# Patient Record
Sex: Female | Born: 1990 | Race: Black or African American | Hispanic: No | Marital: Single | Smoking: Never smoker
Health system: Southern US, Community
[De-identification: ages and names within clinical notes are randomized; demographics above are authoritative.]

## PROBLEM LIST (undated history)

## (undated) DIAGNOSIS — I319 Disease of pericardium, unspecified: Secondary | ICD-10-CM

## (undated) DIAGNOSIS — A64 Unspecified sexually transmitted disease: Secondary | ICD-10-CM

## (undated) HISTORY — DX: Disease of pericardium, unspecified: I31.9

## (undated) HISTORY — DX: Unspecified sexually transmitted disease: A64

## (undated) HISTORY — PX: OTHER SURGICAL HISTORY: SHX169

---
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
## 2011-03-24 ENCOUNTER — Emergency Department (HOSPITAL_COMMUNITY)
Admission: EM | Admit: 2011-03-24 | Discharge: 2011-03-24 | Disposition: A | Discharge status: Home / Self Care | Payer: Medicaid Other | Attending: Emergency Medicine | Admitting: Emergency Medicine

## 2011-03-24 DIAGNOSIS — R0789 Other chest pain: Secondary | ICD-10-CM | POA: Insufficient documentation

## 2011-03-24 DIAGNOSIS — I319 Disease of pericardium, unspecified: Secondary | ICD-10-CM | POA: Insufficient documentation

## 2011-03-24 LAB — POCT I-STAT TROPONIN I: Troponin i, poc: 0 ng/mL (ref 0.00–0.08)

## 2011-03-28 ENCOUNTER — Emergency Department (HOSPITAL_COMMUNITY)
Admission: EM | Admit: 2011-03-28 | Discharge: 2011-03-28 | Disposition: A | Discharge status: Home / Self Care | Payer: Medicaid Other | Attending: Emergency Medicine | Admitting: Emergency Medicine

## 2011-03-28 ENCOUNTER — Emergency Department (HOSPITAL_COMMUNITY)
Admission: EM | Admit: 2011-03-28 | Discharge: 2011-03-28 | Discharge status: Against Medical Advice | Payer: Medicaid Other | Source: Home / Self Care | Attending: Emergency Medicine | Admitting: Emergency Medicine

## 2011-03-28 DIAGNOSIS — N898 Other specified noninflammatory disorders of vagina: Secondary | ICD-10-CM | POA: Insufficient documentation

## 2011-03-28 DIAGNOSIS — R1032 Left lower quadrant pain: Secondary | ICD-10-CM | POA: Insufficient documentation

## 2011-03-28 DIAGNOSIS — R109 Unspecified abdominal pain: Secondary | ICD-10-CM | POA: Insufficient documentation

## 2011-03-28 LAB — URINALYSIS, ROUTINE W REFLEX MICROSCOPIC
Nitrite: NEGATIVE
Protein, ur: 30 mg/dL — AB
Specific Gravity, Urine: 1.031 — ABNORMAL HIGH (ref 1.005–1.030)
Urobilinogen, UA: 0.2 mg/dL (ref 0.0–1.0)

## 2011-03-28 LAB — URINE MICROSCOPIC-ADD ON

## 2011-04-16 ENCOUNTER — Encounter: Payer: Self-pay | Admitting: Internal Medicine

## 2011-04-17 ENCOUNTER — Encounter: Payer: Self-pay | Admitting: Internal Medicine

## 2011-04-17 ENCOUNTER — Ambulatory Visit (INDEPENDENT_AMBULATORY_CARE_PROVIDER_SITE_OTHER): Payer: Medicaid Other | Admitting: Internal Medicine

## 2011-04-17 DIAGNOSIS — R079 Chest pain, unspecified: Secondary | ICD-10-CM

## 2011-04-17 LAB — SEDIMENTATION RATE: Sed Rate: 8 mm/hr (ref 0–22)

## 2011-04-17 NOTE — Progress Notes (Addendum)
HPI Patient notes a burning/stinging sensation in chest   L to R side of chests  Last about 5 minutes.  Minimally pleuritic. Not positional.  Prilosec  No change in symptoms.  Colchicine felt bettre but then it would wear off.  End of 14 days continued. No fevers or chills.   No illness in June.  First spell occurred while at home with mom watching TV.   Comes and goes throughout day.  Shona Needles was first day with no pain.    Today just started.  Having symptoms. Very mild dizziness  No syncope. Dances frequently.  DOes  Not make pain worse. Very SOB on 8/20  Breathing OK now.  No recnet ltravel   Current Outpatient Prescriptions  Medication Sig Dispense Refill  . NON FORMULARY BIRTH CONTROL       . omeprazole (PRILOSEC OTC) 20 MG tablet Take 20 mg by mouth daily.          Past Medical History  Diagnosis Date  . Chest pain   . Pericarditis      No family history on file.  History   Social History  . Marital Status: Single    Spouse Name: N/A    Number of Children: N/A  . Years of Education: N/A   Occupational History  . Not on file.   Social History Main Topics  . Smoking status: Never Smoker   . Smokeless tobacco: Not on file  . Alcohol Use: No  . Drug Use: Yes  . Sexually Active: Not on file   Other Topics Concern  . Not on file   Social History Narrative  . No narrative on file    Review of Systems:  All systems reviewed.  They are negative to the above problem except as previously stated.  Vital Signs: BP 116/72  Pulse 63  Ht 5\' 6"  (1.676 m)  Wt 125 lb (56.7 kg)  BMI 20.18 kg/m2  Physical Exam Patient is in NAD.  HEENT:  Normocephalic, atraumatic. EOMI, PERRLA.  Neck: JVP is normal. No thyromegaly. No bruits.  Lungs: clear to auscultation. No rales no wheezes.  Heart: Regular rate and rhythm. Normal S1, S2. No S3.   No significant murmurs. PMI not displaced.  Abdomen:  Supple, nontender. Normal bowel sounds. No masses. No hepatomegaly.  Extremities:    Good distal pulses throughout. No lower extremity edema.  Musculoskeletal :moving all extremities.  Neuro:   alert and oriented x3.  CN II-XII grossly intact.  EKG:  Sinus rhythm.  61 bpm.  Assessment and Plan:

## 2011-04-17 NOTE — Patient Instructions (Signed)
Your physician recommends that you schedule a follow-up appointment in: as needed. Your physician recommends that you return for lab work in: sed rate, ANA, today Your physician has requested that you have an echocardiogram. Echocardiography is a painless test that uses sound waves to create images of your heart. It provides your doctor with information about the size and shape of your heart and how well your heart's chambers and valves are working. This procedure takes approximately one hour. There are no restrictions for this procedure.

## 2011-04-19 DIAGNOSIS — R079 Chest pain, unspecified: Secondary | ICD-10-CM | POA: Insufficient documentation

## 2011-04-19 NOTE — Assessment & Plan Note (Signed)
I am not convinced the patient's chest pain is pericarditis.  Question musculoskel.  Atypical. I would recomm getting an echo to evaluate.  Continue activities as tolerated.

## 2011-04-27 ENCOUNTER — Ambulatory Visit (HOSPITAL_COMMUNITY): Payer: Medicaid Other | Attending: Internal Medicine | Admitting: Radiology

## 2011-04-27 DIAGNOSIS — R072 Precordial pain: Secondary | ICD-10-CM | POA: Insufficient documentation

## 2011-05-12 ENCOUNTER — Telehealth: Payer: Self-pay | Admitting: Internal Medicine

## 2011-05-12 NOTE — Telephone Encounter (Signed)
Pt calling for echo results, from 2 weeks ago, pls call [phone]

## 2011-05-12 NOTE — Telephone Encounter (Signed)
Pt notified of echo results

## 2011-06-14 ENCOUNTER — Emergency Department (HOSPITAL_COMMUNITY)
Admission: EM | Admit: 2011-06-14 | Discharge: 2011-06-15 | Disposition: A | Discharge status: Home / Self Care | Payer: Medicaid Other | Attending: Emergency Medicine | Admitting: Emergency Medicine

## 2011-06-14 ENCOUNTER — Encounter (HOSPITAL_COMMUNITY): Payer: Self-pay | Admitting: *Deleted

## 2011-06-14 ENCOUNTER — Emergency Department (HOSPITAL_COMMUNITY): Payer: Medicaid Other

## 2011-06-14 DIAGNOSIS — Y92410 Unspecified street and highway as the place of occurrence of the external cause: Secondary | ICD-10-CM | POA: Insufficient documentation

## 2011-06-14 DIAGNOSIS — S8000XA Contusion of unspecified knee, initial encounter: Secondary | ICD-10-CM | POA: Insufficient documentation

## 2011-06-14 DIAGNOSIS — Y9302 Activity, running: Secondary | ICD-10-CM | POA: Insufficient documentation

## 2011-06-14 DIAGNOSIS — W010XXA Fall on same level from slipping, tripping and stumbling without subsequent striking against object, initial encounter: Secondary | ICD-10-CM | POA: Insufficient documentation

## 2011-06-14 NOTE — ED Notes (Signed)
Around 2200 yesterday, the pt was running on a paved road when she fell and landed upon her right knee.  Pt states that she immediately noticed the abrasion and bleeding for which she applied peroxide.  Pt states that she noticed swelling this morning upon waking.  Pt is able to walk but states that she does limp when she walks.  Upon inspection, there is no obvious deformity to the pt's knee.  Pt is able to fully extend her knee without joint pain.

## 2011-06-15 NOTE — ED Provider Notes (Signed)
History     CSN: [id number] Arrival date & time: 06/14/2011 10:56 PM   First MD Initiated Contact with Patient 06/14/11 2315      Chief Complaint  Patient presents with  . Joint Swelling    knee abrasion/swelling    (Consider location/radiation/quality/duration/timing/severity/associated sxs/prior treatment) The history is provided by the patient.    Pt presents to the ED with complaints of falling while running out on the street yesterday. She has an abrasion to her right knee. She states that today when she woke up, she had knee swelling and was walking with a lump. No obvious deformity, no crepitus, not actively bleeding. No head injury, no LOC, no syncope. Pt tripped over her shoe. No chest pain, SOB, lightheadedness or fevers.  Past Medical History  Diagnosis Date  . Chest pain   . Pericarditis     History reviewed. No pertinent past surgical history.  History reviewed. No pertinent family history.  History  Substance Use Topics  . Smoking status: Never Smoker   . Smokeless tobacco: Not on file  . Alcohol Use: No    OB History    Grav Para Term Preterm Abortions TAB SAB Ect Mult Living                  Review of Systems  All other systems reviewed and are negative.    Allergies  Review of patient's allergies indicates no known allergies.  Home Medications   Current Outpatient Rx  Name Route Sig Dispense Refill  . LEVONORGESTREL-ETHINYL ESTRAD 0.1-20 MG-MCG PO TABS Oral Take 1 tablet by mouth daily.        BP 126/72  Pulse 76  Temp(Src) 98.7 F (37.1 C) (Oral)  Resp 18  SpO2 100%  LMP 06/03/2011  Physical Exam  Nursing note and vitals reviewed. Constitutional: She appears well-developed and well-nourished.  HENT:  Head: Normocephalic and atraumatic.  Neck: Trachea normal, normal range of motion and full passive range of motion without pain. Neck supple.  Cardiovascular: Normal rate, regular rhythm and normal pulses.   Pulmonary/Chest:  Effort normal and breath sounds normal. Chest wall is not dull to percussion. She exhibits no tenderness, no crepitus, no edema, no deformity and no retraction.  Abdominal: Normal appearance.  Musculoskeletal:       Right knee: She exhibits decreased range of motion, swelling and effusion. She exhibits no ecchymosis, no deformity, no laceration, no erythema, normal alignment, no LCL laxity, normal patellar mobility and no MCL laxity. tenderness found.       Legs: Neurological: She is alert. She has normal strength.  Skin: Skin is warm, dry and intact.  Psychiatric: She has a normal mood and affect. Her speech is normal and behavior is normal. Judgment and thought content normal. Cognition and memory are normal.    ED Course  Procedures (including critical care time)  Labs Reviewed - No data to display Dg Knee Complete 4 Views Right  06/14/2011  *RADIOLOGY REPORT*  Clinical Data: Status post fall on pavement, with abrasion and pain at the right patella.  RIGHT KNEE - COMPLETE 4+ VIEW  Comparison: None.  Findings: There is no evidence of fracture or dislocation. Visualized essentially fused physes appear grossly unremarkable. The joint spaces are preserved.  No significant degenerative change is seen; the patellofemoral joint is grossly unremarkable in appearance.  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  No significant joint effusion is seen.  The visualized soft tissues are normal in appearance.  IMPRESSION:  1.  No evidence of fracture or dislocation. 2.  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  Original Report Authenticated By: Tonia Ghent, M.D.     No diagnosis found.    MDM  Pt given crutches for comfort  with a referral to Ortho.        Dorthula Matas, PA 06/15/11 [phone]

## 2011-06-16 NOTE — ED Provider Notes (Signed)
Medical screening examination/treatment/procedure(s) were performed by non-physician practitioner and as supervising physician I was immediately available for consultation/collaboration.  Juliet Rude. Rubin Payor, MD 06/16/11 1478

## 2011-08-24 DIAGNOSIS — R51 Headache: Secondary | ICD-10-CM | POA: Insufficient documentation

## 2011-08-24 DIAGNOSIS — Z79899 Other long term (current) drug therapy: Secondary | ICD-10-CM | POA: Insufficient documentation

## 2011-08-24 DIAGNOSIS — R079 Chest pain, unspecified: Secondary | ICD-10-CM | POA: Insufficient documentation

## 2011-08-25 ENCOUNTER — Emergency Department (HOSPITAL_COMMUNITY): Payer: Medicaid Other

## 2011-08-25 ENCOUNTER — Emergency Department (HOSPITAL_COMMUNITY)
Admission: EM | Admit: 2011-08-25 | Discharge: 2011-08-25 | Disposition: A | Discharge status: Home / Self Care | Payer: Medicaid Other | Attending: Emergency Medicine | Admitting: Emergency Medicine

## 2011-08-25 ENCOUNTER — Encounter (HOSPITAL_COMMUNITY): Payer: Self-pay | Admitting: Emergency Medicine

## 2011-08-25 DIAGNOSIS — R51 Headache: Secondary | ICD-10-CM

## 2011-08-25 MED ORDER — IBUPROFEN 800 MG PO TABS: 800.0000 mg | ORAL_TABLET | Freq: Three times a day (TID) | ORAL | Status: AC

## 2011-08-25 MED ORDER — METOCLOPRAMIDE HCL 5 MG/ML IJ SOLN
10.0000 mg | Freq: Once | INTRAMUSCULAR | Status: AC
  Administered 2011-08-25: 10 mg via INTRAMUSCULAR
  Filled 2011-08-25: qty 2

## 2011-08-25 NOTE — ED Notes (Signed)
Pt complains of a headache for one month, no head injury, pain is intermittent

## 2011-08-25 NOTE — ED Provider Notes (Signed)
History     CSN: [id number]  Arrival date & time 08/24/11  2350   First MD Initiated Contact with Patient 08/25/11 [phone]      Chief Complaint  Patient presents with  . Headache    (Consider location/radiation/quality/duration/timing/severity/associated sxs/prior treatment) HPI History provided by pt.  Pt has had an intermittent, throbbing, left-sided headache for the past 2 months.  Occur every single day and usually last 30-45 minutes.  Occasionally debilitating.  No associated fever, dizziness, vision changes or nausea.  No relief w/ tylenol and aspirin.  Denies head trauma.  No h/o migraines.    Past Medical History  Diagnosis Date  . Chest pain   . Pericarditis     History reviewed. No pertinent past surgical history.  Family History  Problem Relation Age of Onset  . Hypertension Other   . Diabetes Other   . Coronary artery disease Other     History  Substance Use Topics  . Smoking status: Never Smoker   . Smokeless tobacco: Not on file  . Alcohol Use: No    OB History    Grav Para Term Preterm Abortions TAB SAB Ect Mult Living                  Review of Systems  All other systems reviewed and are negative.    Allergies  Review of patient's allergies indicates no known allergies.  Home Medications   Current Outpatient Rx  Name Route Sig Dispense Refill  . LEVONORGESTREL-ETHINYL ESTRAD 0.1-20 MG-MCG PO TABS Oral Take 1 tablet by mouth daily.        BP 105/75  Pulse 74  Temp(Src) 98.7 F (37.1 C) (Oral)  Resp 18  SpO2 100%  LMP 08/19/2011  Physical Exam  Nursing note and vitals reviewed. Constitutional: She is oriented to person, place, and time. She appears well-developed and well-nourished. No distress.  HENT:  Head: Normocephalic and atraumatic.  Eyes:       Normal appearance  Neck: Normal range of motion.       No meningeal signs  Cardiovascular: Normal rate and regular rhythm.   Pulmonary/Chest: Effort normal and breath sounds  normal.  Musculoskeletal: Normal range of motion.  Neurological: She is alert and oriented to person, place, and time. She has normal reflexes. No cranial nerve deficit or sensory deficit. Coordination normal.       5/5 and equal upper and lower extremity strength.  No past pointing.     Skin: Skin is warm and dry. No rash noted.  Psychiatric: She has a normal mood and affect. Her behavior is normal.    ED Course  Procedures (including critical care time)  Labs Reviewed - No data to display Ct Head Wo Contrast  08/25/2011  *RADIOLOGY REPORT*  Clinical Data: Intermittent headaches for 1 month.  No injury.  CT HEAD WITHOUT CONTRAST  Technique:  Contiguous axial images were obtained from the base of the skull through the vertex without contrast.  Comparison: None.  Findings: The ventricles and sulci appear symmetrical.  No mass effect or midline shift.  No abnormal extra-axial fluid collections.  Gray-white matter junctions are distinct.  Basal cisterns are not effaced.  No evidence of acute intracranial hemorrhage.  Visualized paranasal sinuses are not opacified.  No depressed skull fractures.  IMPRESSION: No evidence of acute intracranial hemorrhage, mass lesion, or acute infarct.  Original Report Authenticated By: Marlon Pel, M.D.     1. Headache  MDM  Healthy 21yo F presents w/ c/o intermittent, non-traumatic headache x 2 months.  Pt well appearing, afebrile, no meningeal signs or focal neuro deficits on exam.  CT head neg.  Pt reassured and d/c'd home w/ 800mg  ibuprofen as well as referral to neurology.  Return precautions discussed.         Arie Sabina Harmonii Karle, PA 08/25/11 1700

## 2011-08-25 NOTE — Discharge Instructions (Signed)
Take ibuprofen w/ food up to three times a day, as needed for pain.  Follow up with a neurologist as soon as possible. Call to schedule an appointment today because it may take 2-3 weeks.  You should return to the ER if your pain worsens or you develop associated fever.

## 2011-08-25 NOTE — ED Notes (Signed)
Pt states her headache started last month and she has taken sinus medication and tylenol and ibuprofen without relief  Pt states the pain comes and goes throughout the day   No other sxs with the headaches

## 2011-08-26 NOTE — ED Provider Notes (Signed)
Medical screening examination/treatment/procedure(s) were performed by non-physician practitioner and as supervising physician I was immediately available for consultation/collaboration.  Georgie Haque K Susie Ehresman-Rasch, MD 08/26/11 2301 

## 2012-04-14 ENCOUNTER — Encounter (HOSPITAL_COMMUNITY): Payer: Self-pay

## 2012-04-14 ENCOUNTER — Emergency Department (INDEPENDENT_AMBULATORY_CARE_PROVIDER_SITE_OTHER)
Admission: EM | Admit: 2012-04-14 | Discharge: 2012-04-14 | Disposition: A | Discharge status: Home / Self Care | Payer: BC Managed Care – PPO | Source: Home / Self Care | Attending: Family Medicine | Admitting: Family Medicine

## 2012-04-14 DIAGNOSIS — J029 Acute pharyngitis, unspecified: Secondary | ICD-10-CM

## 2012-04-14 DIAGNOSIS — J329 Chronic sinusitis, unspecified: Secondary | ICD-10-CM

## 2012-04-14 LAB — POCT RAPID STREP A: Streptococcus, Group A Screen (Direct): NEGATIVE

## 2012-04-14 MED ORDER — CETIRIZINE-PSEUDOEPHEDRINE ER 5-120 MG PO TB12: 1.0000 | ORAL_TABLET | Freq: Two times a day (BID) | ORAL | Status: DC

## 2012-04-14 MED ORDER — IBUPROFEN 600 MG PO TABS: 600.0000 mg | ORAL_TABLET | Freq: Three times a day (TID) | ORAL | Status: DC | PRN

## 2012-04-14 MED ORDER — PREDNISONE 20 MG PO TABS: ORAL_TABLET | ORAL | Status: DC

## 2012-04-14 MED ORDER — CEPHALEXIN 500 MG PO CAPS: 500.0000 mg | ORAL_CAPSULE | Freq: Two times a day (BID) | ORAL | Status: DC

## 2012-04-14 NOTE — ED Provider Notes (Signed)
History     CSN: [id number]  Arrival date & time 04/14/12  1118   First MD Initiated Contact with Patient 04/14/12 1216      Chief Complaint  Patient presents with  . Sore Throat    (Consider location/radiation/quality/duration/timing/severity/associated sxs/prior treatment) HPI Comments: 21 year old female with and past history significant for pericarditis. Here complaining of nasal congestion with thick green discharge for one week. Status she has had chills but has not taken her temperature. Symptoms associated with sore throat and non productive cough in the last 5 days. Reports headache. Denies visual changes. Denies nausea vomiting or diarrhea. Denies abdominal pain. No rash.   Past Medical History  Diagnosis Date  . Chest pain   . Pericarditis     History reviewed. No pertinent past surgical history.  Family History  Problem Relation Age of Onset  . Hypertension Other   . Diabetes Other   . Coronary artery disease Other     History  Substance Use Topics  . Smoking status: Never Smoker   . Smokeless tobacco: Not on file  . Alcohol Use: No    OB History    Grav Para Term Preterm Abortions TAB SAB Ect Mult Living                  Review of Systems  Constitutional: Positive for chills and appetite change.  HENT: Positive for congestion and rhinorrhea. Negative for neck pain.   Respiratory: Positive for cough. Negative for shortness of breath and wheezing.   Cardiovascular: Negative for chest pain.  Gastrointestinal: Negative for nausea, abdominal pain and diarrhea.  Genitourinary: Negative for dysuria.  Musculoskeletal: Negative for myalgias and arthralgias.  Skin: Negative for rash.  Neurological: Positive for headaches. Negative for dizziness.    Allergies  Review of patient's allergies indicates no known allergies.  Home Medications   Current Outpatient Rx  Name Route Sig Dispense Refill  . LEVONORGESTREL-ETHINYL ESTRAD 0.1-20 MG-MCG PO TABS Oral  Take 1 tablet by mouth daily.      . CEPHALEXIN 500 MG PO CAPS Oral Take 1 capsule (500 mg total) by mouth 2 (two) times daily. 20 capsule 0  . CETIRIZINE-PSEUDOEPHEDRINE ER 5-120 MG PO TB12 Oral Take 1 tablet by mouth 2 (two) times daily. 30 tablet 0  . IBUPROFEN 600 MG PO TABS Oral Take 1 tablet (600 mg total) by mouth every 8 (eight) hours as needed for pain or fever. 20 tablet 0  . PREDNISONE 20 MG PO TABS  Tabs by mouth daily for 5 days. 10 tablet 0    BP 106/71  Pulse 68  Temp 98.3 F (36.8 C) (Oral)  Resp 16  SpO2 100%  LMP 03/31/2012  Physical Exam  Nursing note and vitals reviewed. Constitutional: She is oriented to person, place, and time. She appears well-developed and well-nourished. No distress.  HENT:  Head: Normocephalic and atraumatic.       Nasal Congestion with erythema and swelling of nasal turbinates, thick yellow rhinorrhea. pharyngeal erythema with enlarged erythematous tonsils no exudates exudates. No uvula deviation. No trismus. TM's with increased vascular markings and some dullness bilaterally no swelling or bulging   Eyes: Conjunctivae normal are normal. Right eye exhibits no discharge. Left eye exhibits no discharge.  Neck: Neck supple. No thyromegaly present.  Cardiovascular: Normal rate, regular rhythm and normal heart sounds.   No murmur heard. Pulmonary/Chest: Effort normal and breath sounds normal. No respiratory distress. She has no wheezes. She has no rales. She exhibits no  tenderness.  Abdominal: Soft. Bowel sounds are normal. She exhibits no distension and no mass. There is no tenderness. There is no rebound and no guarding.       No HSM  Lymphadenopathy:    She has cervical adenopathy.  Neurological: She is alert and oriented to person, place, and time.  Skin: No rash noted.    ED Course  Procedures (including critical care time)   Labs Reviewed  POCT RAPID STREP A ([facility] CARE ONLY)   No results found.   1. Sinusitis   2.  Pharyngitis       MDM  History and clinical findings suggestive of sinusitis, also tonsils enlarged and erythematous on exam. Rapid strep test negative.  Patient is afebrile here. No tenderness or enlarged spleen. Prescribed prednisone, Keflex, ibuprofen, cetirizine/pseudoephedrine. Supportive care discussed with patient and provided in writing. Asked to return if persistent or worsening symptoms despite following treatment.        Sharin Grave, MD 04/15/12 1112

## 2012-04-14 NOTE — ED Notes (Signed)
Complains of sore throat and fever since 9/28

## 2012-07-29 ENCOUNTER — Emergency Department (HOSPITAL_COMMUNITY)
Admission: EM | Admit: 2012-07-29 | Discharge: 2012-07-30 | Disposition: A | Discharge status: Home / Self Care | Payer: BC Managed Care – PPO | Attending: Emergency Medicine | Admitting: Emergency Medicine

## 2012-07-29 ENCOUNTER — Encounter (HOSPITAL_COMMUNITY): Payer: Self-pay | Admitting: *Deleted

## 2012-07-29 DIAGNOSIS — T7840XA Allergy, unspecified, initial encounter: Secondary | ICD-10-CM | POA: Insufficient documentation

## 2012-07-29 DIAGNOSIS — T4995XA Adverse effect of unspecified topical agent, initial encounter: Secondary | ICD-10-CM | POA: Insufficient documentation

## 2012-07-29 DIAGNOSIS — Z79899 Other long term (current) drug therapy: Secondary | ICD-10-CM | POA: Insufficient documentation

## 2012-07-29 DIAGNOSIS — Z8679 Personal history of other diseases of the circulatory system: Secondary | ICD-10-CM | POA: Insufficient documentation

## 2012-07-29 DIAGNOSIS — L500 Allergic urticaria: Secondary | ICD-10-CM | POA: Insufficient documentation

## 2012-07-29 NOTE — ED Notes (Signed)
Pt also has them on bilateral legs.

## 2012-07-29 NOTE — ED Notes (Signed)
Pt has discreet indurations on bilateral arms starting x 1 hr ago. Pt c/o itching.

## 2012-07-30 MED ORDER — FAMOTIDINE 20 MG PO TABS
20.0000 mg | ORAL_TABLET | Freq: Once | ORAL | Status: AC
  Administered 2012-07-30: 20 mg via ORAL
  Filled 2012-07-30: qty 1

## 2012-07-30 MED ORDER — DIPHENHYDRAMINE HCL 12.5 MG/5ML PO ELIX
25.0000 mg | ORAL_SOLUTION | Freq: Once | ORAL | Status: AC
  Administered 2012-07-30: 25 mg via ORAL
  Filled 2012-07-30: qty 10

## 2012-07-30 MED ORDER — PREDNISONE 20 MG PO TABS: ORAL_TABLET | ORAL | Status: DC

## 2012-07-30 MED ORDER — FAMOTIDINE 20 MG PO TABS: ORAL_TABLET | ORAL | Status: DC

## 2012-07-30 MED ORDER — DIPHENHYDRAMINE HCL 25 MG PO TABS: ORAL_TABLET | ORAL | Status: DC

## 2012-07-30 MED ORDER — PREDNISONE 20 MG PO TABS
60.0000 mg | ORAL_TABLET | Freq: Once | ORAL | Status: AC
  Administered 2012-07-30: 60 mg via ORAL
  Filled 2012-07-30: qty 3

## 2012-08-01 NOTE — ED Provider Notes (Signed)
History     CSN: [id number]  Arrival date & time 07/29/12  2116   First MD Initiated Contact with Patient 07/30/12 0022      Chief Complaint  Patient presents with  . Rash  . Insect Bite    (Consider location/radiation/quality/duration/timing/severity/associated sxs/prior treatment) HPI Comments: 22 y.o. female presents today complaining of acute onset hives and itching that started an hour ago.  Patient rates severity 10/10. Course is constant. Patient took no interventions. Itching is localized to hives on bilateral arms/legs and does not radiate. Pt denies any known allergens, using new detergent, wearing new clothing, working with chemicals, or having had any contact with an insect.    Past Medical History  Diagnosis Date  . Chest pain   . Pericarditis     History reviewed. No pertinent past surgical history.  Family History  Problem Relation Age of Onset  . Hypertension Other   . Diabetes Other   . Coronary artery disease Other     History  Substance Use Topics  . Smoking status: Never Smoker   . Smokeless tobacco: Not on file  . Alcohol Use: No    OB History    Grav Para Term Preterm Abortions TAB SAB Ect Mult Living                  Review of Systems  Constitutional: Negative for fever and diaphoresis.  HENT: Negative for neck pain and neck stiffness.   Eyes: Negative for visual disturbance.  Respiratory: Negative for apnea, chest tightness and shortness of breath.   Cardiovascular: Negative for chest pain and palpitations.  Gastrointestinal: Negative for nausea, vomiting, diarrhea and constipation.  Genitourinary: Negative for dysuria.  Musculoskeletal: Negative for gait problem.  Skin:       Itchy, wheals, bilateral arms and legs, shoulders  Neurological: Negative for dizziness, weakness, light-headedness, numbness and headaches.    Allergies  Review of patient's allergies indicates no known allergies.  Home Medications   Current Outpatient  Rx  Name  Route  Sig  Dispense  Refill  . LEVONORGESTREL-ETHINYL ESTRAD 0.1-20 MG-MCG PO TABS   Oral   Take 1 tablet by mouth daily.           . CEPHALEXIN 500 MG PO CAPS   Oral   Take 1 capsule (500 mg total) by mouth 2 (two) times daily.   20 capsule   0   . DIPHENHYDRAMINE HCL 25 MG PO TABS      Take one tablet twice daily for 3 days   6 tablet   0   . FAMOTIDINE 20 MG PO TABS      Take one tablet twice daily for 3 days.   6 tablet   0   . PREDNISONE 20 MG PO TABS      Tabs by mouth daily for 5 days.   10 tablet   0   . PREDNISONE 20 MG PO TABS      Take 60 mg by mouth daily for 5 days   15 tablet   0     BP 126/78  Pulse 60  Temp 98.3 F (36.8 C) (Oral)  Resp 18  SpO2 100%  LMP 07/19/2012  Physical Exam  Nursing note and vitals reviewed. Constitutional: She is oriented to person, place, and time. She appears well-developed and well-nourished. No distress.  HENT:  Head: Normocephalic and atraumatic.  Eyes: EOM are normal. Pupils are equal, round, and reactive to light.  Neck: Normal range  of motion. Neck supple.       No meningeal signs, no swelling  Cardiovascular: Normal rate, regular rhythm and normal heart sounds.  Exam reveals no gallop and no friction rub.   No murmur heard. Pulmonary/Chest: Effort normal and breath sounds normal. No respiratory distress. She has no wheezes. She has no rales. She exhibits no tenderness.  Abdominal: Soft. Bowel sounds are normal. She exhibits no distension. There is no tenderness. There is no rebound and no guarding.  Musculoskeletal: Normal range of motion. She exhibits no edema and no tenderness.  Neurological: She is alert and oriented to person, place, and time. No cranial nerve deficit.  Skin: Skin is warm and dry. She is not diaphoretic. There is erythema.       Erythematous wheals appox 1-2 cm bilateral arms, legs, and shoulders.     ED Course  Procedures (including critical care time)  Labs Reviewed  - No data to display No results found.   1. Allergic reaction       MDM  Unknown allergen. Pt treated with prednisone, pepcid, and benadryl in ED with relief. Patient re-evaluated prior to dc, is hemodynamically stable, in no respiratory distress, and denies the feeling of throat closing. Pt given prescriptions for same and told to return to the ED if they have a mod-severe allergic rxn (s/s including throat closing, difficulty breathing, swelling of lips face or tongue). Pt is to follow up with their PCP. Pt is agreeable with plan & verbalizes understanding.  Glade Nurse, PA-C 08/01/12 1507  Glade Nurse, PA-C 08/08/12 2054

## 2012-08-09 NOTE — ED Provider Notes (Signed)
Medical screening examination/treatment/procedure(s) were performed by non-physician practitioner and as supervising physician I was immediately available for consultation/collaboration.   Carleene Cooper III, MD 08/09/12 [phone]

## 2013-05-24 ENCOUNTER — Ambulatory Visit (INDEPENDENT_AMBULATORY_CARE_PROVIDER_SITE_OTHER): Payer: BC Managed Care – PPO | Admitting: Nurse Practitioner

## 2013-05-24 ENCOUNTER — Encounter: Payer: Self-pay | Admitting: Nurse Practitioner

## 2013-05-24 VITALS — BP 108/64 | HR 68 | Resp 16 | Ht 66.25 in | Wt 134.0 lb

## 2013-05-24 DIAGNOSIS — Z01419 Encounter for gynecological examination (general) (routine) without abnormal findings: Secondary | ICD-10-CM

## 2013-05-24 DIAGNOSIS — Z0000 Encounter for general adult medical examination without abnormal findings: Secondary | ICD-10-CM

## 2013-05-24 DIAGNOSIS — Z113 Encounter for screening for infections with a predominantly sexual mode of transmission: Secondary | ICD-10-CM

## 2013-05-24 LAB — POCT URINALYSIS DIPSTICK
Leukocytes, UA: NEGATIVE
Nitrite, UA: NEGATIVE
Protein, UA: NEGATIVE
Urobilinogen, UA: NEGATIVE

## 2013-05-24 LAB — HEMOGLOBIN, FINGERSTICK: Hemoglobin, fingerstick: 12.4 g/dL (ref 12.0–16.0)

## 2013-05-24 MED ORDER — LEVONORGESTREL-ETHINYL ESTRAD 0.1-20 MG-MCG PO TABS: 1.0000 | ORAL_TABLET | Freq: Every day | ORAL | Status: DC

## 2013-05-24 NOTE — Progress Notes (Signed)
Patient ID: Patricia Novak, female   DOB: 04/13/1991, 22 y.o.   MRN: [id number] 22 y.o. G1P0010 Single African American Fe here for annual exam.  Menses now at 3-4 days.  Moderate to light, cramps 1 week prior. No PMS. Same partner for 1 year.last STD's over 1 year ago normal.+ Chlamydia in 2011.  Patient's last menstrual period was 05/22/2013.          Sexually active: yes  The current method of family planning is OCP (estrogen/progesterone).    Exercising: yes  Gym/ health club routine includes cardio and dance conditioning every other day. Smoker:  no  Health Maintenance: Pap:  never TDaP:  2011 Gardasil completed 2008 Labs: HB: 12.4 Urine: lg blood, pH 6.0 (on menses)   reports that she has never smoked. She has never used smokeless tobacco. She reports that she drinks about 1.5 ounces of alcohol per week. She reports that she does not use illicit drugs.  Past Medical History  Diagnosis Date  . Chest pain   . Pericarditis     Past Surgical History  Procedure Laterality Date  . Wisdom teeth      Current Outpatient Prescriptions  Medication Sig Dispense Refill  . levonorgestrel-ethinyl estradiol (AVIANE,ALESSE,LESSINA) 0.1-20 MG-MCG tablet Take 1 tablet by mouth daily.         No current facility-administered medications for this visit.    Family History  Problem Relation Age of Onset  . Hypertension Other   . Diabetes Other   . Coronary artery disease Other     ROS:  Pertinent items are noted in HPI.  Otherwise, a comprehensive ROS was negative.  Exam:   BP 108/64  Pulse 68  Resp 16  Ht 5' 6.25" (1.683 m)  Wt 134 lb (60.782 kg)  BMI 21.46 kg/m2  LMP 05/22/2013 Height: 5' 6.25" (168.3 cm)  Ht Readings from Last 3 Encounters:  05/24/13 5' 6.25" (1.683 m)  04/17/11 5\' 6"  (1.676 m) (75%*, Z = 0.67)   * Growth percentiles are based on CDC 2-20 Years data.    General appearance: alert, cooperative and appears stated age Head: Normocephalic, without obvious  abnormality, atraumatic Neck: no adenopathy, supple, symmetrical, trachea midline and thyroid normal to inspection and palpation Lungs: clear to auscultation bilaterally Breasts: normal appearance, no masses or tenderness Heart: regular rate and rhythm Abdomen: soft, non-tender; no masses,  no organomegaly Extremities: extremities normal, atraumatic, no cyanosis or edema Skin: Skin color, texture, turgor normal. No rashes or lesions. tattoos on back Lymph nodes: Cervical, supraclavicular, and axillary nodes normal. No abnormal inguinal nodes palpated Neurologic: Grossly normal   Pelvic: External genitalia:  no lesions              Urethra:  normal appearing urethra with no masses, tenderness or lesions              Bartholin's and Skene's: normal                 Vagina: normal appearing vagina with normal color and discharge, no lesions, moderate menstrual flow.              Cervix: anteverted              Pap taken: yes Bimanual Exam:  Uterus:  normal size, contour, position, consistency, mobility, non-tender              Adnexa: no mass, fullness, tenderness  Rectovaginal: Confirms               Anus:  normal sphincter tone, no lesions  A:  Well Woman with normal exam  Contraception  R/O STD's  P:   Pap smear as per guidelines done today  Refill on OCP for a year  Call with STD's   Counseled on breast self exam, STD prevention, use and side effects of OCP's, adequate intake of calcium and vitamin D, diet and exercise return annually or prn  An After Visit Summary was printed and given to the patient.

## 2013-05-24 NOTE — Patient Instructions (Signed)
General topics  Next pap or exam is  due in 1 year Take a Women's multivitamin Take 1200 mg. of calcium daily - prefer dietary If any concerns in interim to call back  Breast Self-Awareness Practicing breast self-awareness may pick up problems early, prevent significant medical complications, and possibly save your life. By practicing breast self-awareness, you can become familiar with how your breasts look and feel and if your breasts are changing. This allows you to notice changes early. It can also offer you some reassurance that your breast health is good. One way to learn what is normal for your breasts and whether your breasts are changing is to do a breast self-exam. If you find a lump or something that was not present in the past, it is best to contact your caregiver right away. Other findings that should be evaluated by your caregiver include nipple discharge, especially if it is bloody; skin changes or reddening; areas where the skin seems to be pulled in (retracted); or new lumps and bumps. Breast pain is seldom associated with cancer (malignancy), but should also be evaluated by a caregiver. BREAST SELF-EXAM The best time to examine your breasts is 5 7 days after your menstrual period is over.  ExitCare Patient Information 2013 [employer].   Exercise to Stay Healthy Exercise helps you become and stay healthy. EXERCISE IDEAS AND TIPS Choose exercises that:  You enjoy.  Fit into your day. You do not need to exercise really hard to be healthy. You can do exercises at a slow or medium level and stay healthy. You can:  Stretch before and after working out.  Try yoga, Pilates, or tai chi.  Lift weights.  Walk fast, swim, jog, run, climb stairs, bicycle, dance, or rollerskate.  Take aerobic classes. Exercises that burn about 150 calories:  Running 1  miles in 15 minutes.  Playing volleyball for 45 to 60 minutes.  Washing and waxing a car for 45 to 60  minutes.  Playing touch football for 45 minutes.  Walking 1  miles in 35 minutes.  Pushing a stroller 1  miles in 30 minutes.  Playing basketball for 30 minutes.  Raking leaves for 30 minutes.  Bicycling 5 miles in 30 minutes.  Walking 2 miles in 30 minutes.  Dancing for 30 minutes.  Shoveling snow for 15 minutes.  Swimming laps for 20 minutes.  Walking up stairs for 15 minutes.  Bicycling 4 miles in 15 minutes.  Gardening for 30 to 45 minutes.  Jumping rope for 15 minutes.  Washing windows or floors for 45 to 60 minutes. Document Released: 08/01/2010 Document Revised: 09/21/2011 Document Reviewed: 08/01/2010 ExitCare Patient Information 2013 [employer].   Other topics ( that may be useful information):    Sexually Transmitted Disease Sexually transmitted disease (STD) refers to any infection that is passed from person to person during sexual activity. This may happen by way of saliva, semen, blood, vaginal mucus, or urine. Common STDs include:  Gonorrhea.  Chlamydia.  Syphilis.  HIV/AIDS.  Genital herpes.  Hepatitis B and C.  Trichomonas.  Human papillomavirus (HPV).  Pubic lice. CAUSES  An STD may be spread by bacteria, virus, or parasite. A person can get an STD by:  Sexual intercourse with an infected person.  Sharing sex toys with an infected person.  Sharing needles with an infected person.  Having intimate contact with the genitals, mouth, or rectal areas of an infected person. SYMPTOMS  Some people may not have any symptoms, but   they can still pass the infection to others. Different STDs have different symptoms. Symptoms include:  Painful or bloody urination.  Pain in the pelvis, abdomen, vagina, anus, throat, or eyes.  Skin rash, itching, irritation, growths, or sores (lesions). These usually occur in the genital or anal area.  Abnormal vaginal discharge.  Penile discharge in men.  Soft, flesh-colored skin growths in the  genital or anal area.  Fever.  Pain or bleeding during sexual intercourse.  Swollen glands in the groin area.  Yellow skin and eyes (jaundice). This is seen with hepatitis. DIAGNOSIS  To make a diagnosis, your caregiver may:  Take a medical history.  Perform a physical exam.  Take a specimen (culture) to be examined.  Examine a sample of discharge under a microscope.  Perform blood test TREATMENT   Chlamydia, gonorrhea, trichomonas, and syphilis can be cured with antibiotic medicine.  Genital herpes, hepatitis, and HIV can be treated, but not cured, with prescribed medicines. The medicines will lessen the symptoms.  Genital warts from HPV can be treated with medicine or by freezing, burning (electrocautery), or surgery. Warts may come back.  HPV is a virus and cannot be cured with medicine or surgery.However, abnormal areas may be followed very closely by your caregiver and may be removed from the cervix, vagina, or vulva through office procedures or surgery. If your diagnosis is confirmed, your recent sexual partners need treatment. This is true even if they are symptom-free or have a negative culture or evaluation. They should not have sex until their caregiver says it is okay. HOME CARE INSTRUCTIONS  All sexual partners should be informed, tested, and treated for all STDs.  Take your antibiotics as directed. Finish them even if you start to feel better.  Only take over-the-counter or prescription medicines for pain, discomfort, or fever as directed by your caregiver.  Rest.  Eat a balanced diet and drink enough fluids to keep your urine clear or pale yellow.  Do not have sex until treatment is completed and you have followed up with your caregiver. STDs should be checked after treatment.  Keep all follow-up appointments, Pap tests, and blood tests as directed by your caregiver.  Only use latex condoms and water-soluble lubricants during sexual activity. Do not use  petroleum jelly or oils.  Avoid alcohol and illegal drugs.  Get vaccinated for HPV and hepatitis. If you have not received these vaccines in the past, talk to your caregiver about whether one or both might be right for you.  Avoid risky sex practices that can break the skin. The only way to avoid getting an STD is to avoid all sexual activity.Latex condoms and dental dams (for oral sex) will help lessen the risk of getting an STD, but will not completely eliminate the risk. SEEK MEDICAL CARE IF:   You have a fever.  You have any new or worsening symptoms. Document Released: 09/19/2002 Document Revised: 09/21/2011 Document Reviewed: 09/26/2010 ExitCare Patient Information 2013 [employer].    Domestic Abuse You are being battered or abused if someone close to you hits, pushes, or physically hurts you in any way. You also are being abused if you are forced into activities. You are being sexually abused if you are forced to have sexual contact of any kind. You are being emotionally abused if you are made to feel worthless or if you are constantly threatened. It is important to remember that help is available. No one has the right to abuse you. PREVENTION OF FURTHER   ABUSE  Learn the warning signs of danger. This varies with situations but may include: the use of alcohol, threats, isolation from friends and family, or forced sexual contact. Leave if you feel that violence is going to occur.  If you are attacked or beaten, report it to the police so the abuse is documented. You do not have to press charges. The police can protect you while you or the attackers are leaving. Get the officer's name and badge number and a copy of the report.  Find someone you can trust and tell them what is happening to you: your caregiver, a nurse, clergy member, close friend or family member. Feeling ashamed is natural, but remember that you have done nothing wrong. No one deserves abuse. Document Released:  06/26/2000 Document Revised: 09/21/2011 Document Reviewed: 09/04/2010 ExitCare Patient Information 2013 [employer].    How Much is Too Much Alcohol? Drinking too much alcohol can cause injury, accidents, and health problems. These types of problems can include:   Car crashes.  Falls.  Family fighting (domestic violence).  Drowning.  Fights.  Injuries.  Burns.  Damage to certain organs.  Having a baby with birth defects. ONE DRINK CAN BE TOO MUCH WHEN YOU ARE:  Working.  Pregnant or breastfeeding.  Taking medicines. Ask your doctor.  Driving or planning to drive. If you or someone you know has a drinking problem, get help from a doctor.  Document Released: 04/25/2009 Document Revised: 09/21/2011 Document Reviewed: 04/25/2009 ExitCare Patient Information 2013 [employer].   Smoking Hazards Smoking cigarettes is extremely bad for your health. Tobacco smoke has over 200 known poisons in it. There are over 60 chemicals in tobacco smoke that cause cancer. Some of the chemicals found in cigarette smoke include:   Cyanide.  Benzene.  Formaldehyde.  Methanol (wood alcohol).  Acetylene (fuel used in welding torches).  Ammonia. Cigarette smoke also contains the poisonous gases nitrogen oxide and carbon monoxide.  Cigarette smokers have an increased risk of many serious medical problems and Smoking causes approximately:  90% of all lung cancer deaths in men.  80% of all lung cancer deaths in women.  90% of deaths from chronic obstructive lung disease. Compared with nonsmokers, smoking increases the risk of:  Coronary heart disease by 2 to 4 times.  Stroke by 2 to 4 times.  Men developing lung cancer by 23 times.  Women developing lung cancer by 13 times.  Dying from chronic obstructive lung diseases by 12 times.  . Smoking is the most preventable cause of death and disease in our society.  WHY IS SMOKING ADDICTIVE?  Nicotine is the chemical  agent in tobacco that is capable of causing addiction or dependence.  When you smoke and inhale, nicotine is absorbed rapidly into the bloodstream through your lungs. Nicotine absorbed through the lungs is capable of creating a powerful addiction. Both inhaled and non-inhaled nicotine may be addictive.  Addiction studies of cigarettes and spit tobacco show that addiction to nicotine occurs mainly during the teen years, when young people begin using tobacco products. WHAT ARE THE BENEFITS OF QUITTING?  There are many health benefits to quitting smoking.   Likelihood of developing cancer and heart disease decreases. Health improvements are seen almost immediately.  Blood pressure, pulse rate, and breathing patterns start returning to normal soon after quitting. QUITTING SMOKING   American Lung Association - 1-800-LUNGUSA  American Cancer Society - 1-800-ACS-2345 Document Released: 08/06/2004 Document Revised: 09/21/2011 Document Reviewed: 04/10/2009 ExitCare Patient Information 2013 ExitCare[employer]   LL[employer].   Stress Management Stress is a state of physical or mental tension that often results from changes in your life or normal routine. Some common causes of stress are:  Death of a loved one.  Injuries or severe illnesses.  Getting fired or changing jobs.  Moving into a new home. Other causes may be:  Sexual problems.  Business or financial losses.  Taking on a large debt.  Regular conflict with someone at home or at work.  Constant tiredness from lack of sleep. It is not just bad things that are stressful. It may be stressful to:  Win the lottery.  Get married.  Buy a new car. The amount of stress that can be easily tolerated varies from person to person. Changes generally cause stress, regardless of the types of change. Too much stress can affect your health. It may lead to physical or emotional problems. Too little stress (boredom) may also become stressful. SUGGESTIONS TO  REDUCE STRESS:  Talk things over with your family and friends. It often is helpful to share your concerns and worries. If you feel your problem is serious, you may want to get help from a professional counselor.  Consider your problems one at a time instead of lumping them all together. Trying to take care of everything at once may seem impossible. List all the things you need to do and then start with the most important one. Set a goal to accomplish 2 or 3 things each day. If you expect to do too many in a single day you will naturally fail, causing you to feel even more stressed.  Do not use alcohol or drugs to relieve stress. Although you may feel better for a short time, they do not remove the problems that caused the stress. They can also be habit forming.  Exercise regularly - at least 3 times per week. Physical exercise can help to relieve that "uptight" feeling and will relax you.  The shortest distance between despair and hope is often a good night's sleep.  Go to bed and get up on time allowing yourself time for appointments without being rushed.  Take a short "time-out" period from any stressful situation that occurs during the day. Close your eyes and take some deep breaths. Starting with the muscles in your face, tense them, hold it for a few seconds, then relax. Repeat this with the muscles in your neck, shoulders, hand, stomach, back and legs.  Take good care of yourself. Eat a balanced diet and get plenty of rest.  Schedule time for having fun. Take a break from your daily routine to relax. HOME CARE INSTRUCTIONS   Call if you feel overwhelmed by your problems and feel you can no longer manage them on your own.  Return immediately if you feel like hurting yourself or someone else. Document Released: 12/23/2000 Document Revised: 09/21/2011 Document Reviewed: 08/15/2007 ExitCare Patient Information 2013 [employer].   

## 2013-05-25 LAB — IPS PAP TEST WITH REFLEX TO HPV

## 2013-05-26 LAB — IPS N GONORRHOEA AND CHLAMYDIA BY PCR

## 2013-05-28 NOTE — Progress Notes (Signed)
Encounter reviewed by Dr. Lieutenant Abarca Silva.  

## 2013-05-31 ENCOUNTER — Other Ambulatory Visit: Payer: Self-pay | Admitting: Obstetrics and Gynecology

## 2013-05-31 MED ORDER — AZITHROMYCIN 250 MG PO TABS: ORAL_TABLET | ORAL | Status: DC

## 2013-08-28 ENCOUNTER — Ambulatory Visit: Payer: Self-pay | Admitting: Nurse Practitioner

## 2013-09-04 ENCOUNTER — Ambulatory Visit (INDEPENDENT_AMBULATORY_CARE_PROVIDER_SITE_OTHER): Payer: BC Managed Care – PPO | Admitting: Nurse Practitioner

## 2013-09-04 VITALS — BP 118/62 | HR 64 | Resp 16 | Ht 66.25 in | Wt 135.2 lb

## 2013-09-04 DIAGNOSIS — Z113 Encounter for screening for infections with a predominantly sexual mode of transmission: Secondary | ICD-10-CM

## 2013-09-04 NOTE — Progress Notes (Signed)
Subjective:     Patient ID: Patricia Novak, female   DOB: [birth year], 23 y.o.   MRN: [id number]  HPI  This 23 yo S AA Fe comes in for a TOC of + chlamydia found 11/12 14.  She was treated and partner was treated after she was SA again with him. They are no longer friends.  She has no pelvic pain or vaginal symptoms.  New partner for a week but not SA.  Will await these test results. She also describes "chest pain" since on this new generic brand of OCP in November. States the pain is upper chest centrally located and usually not radiating.  It occurs about 2-3 times a day and last less than 5 minutes. Pain is not associated with dyspnea, palpitations, or a sense of feeling unwell.  Does not seem to be associated with activity or with foods.  She denies GI symptoms but does frequently eat spicy foods. She has ben evaluated at [facility] cardiology with similar chest pain in 2012. Echo was normal.  She was on OCP at that time as well.  Review of Systems  Constitutional: Negative for fever, chills, diaphoresis and fatigue.  HENT: Negative.   Respiratory: Negative.   Cardiovascular: Positive for chest pain. Negative for palpitations and leg swelling.  Gastrointestinal: Negative.  Negative for nausea, vomiting, abdominal pain, diarrhea, constipation and blood in stool.  Genitourinary: Negative for dysuria, urgency, frequency, hematuria, flank pain, vaginal bleeding, vaginal discharge, vaginal pain, pelvic pain and dyspareunia.  Musculoskeletal: Negative.   Skin: Negative.   Neurological: Negative.   Psychiatric/Behavioral: Negative.        Objective:   Physical Exam  Constitutional: She is oriented to person, place, and time. She appears well-developed and well-nourished. No distress.  Cardiovascular: Normal rate and normal heart sounds.   Pulmonary/Chest: Effort normal. No respiratory distress. She has no wheezes. She has no rales. She exhibits no tenderness.  Abdominal: Soft. She exhibits no  distension and no mass. There is no tenderness. There is no rebound and no guarding.  Genitourinary:  Normal vaginal discharge. No cervicitis. Specimen for Gc/ chl.  No pain on bimanual exsm  Neurological: She is alert and oriented to person, place, and time.  Psychiatric: She has a normal mood and affect. Her behavior is normal. Judgment and thought content normal.       Assessment:     TOC for chlamydia Chest pain ? Etiology with previous cardio evealuation    Plan:     Will follow with test results She will DC OCP in 2 days after this pack of OCP.  She will then see if all chest pain goes away and if so will need to discuss other methods of birth control such as IUD or Implanon. If she becomes SA in the interim with this new partner will be using condoms each and every time. If her chest pain continues will see PCP about other issues such as GI that may be causing symptoms.  We will want a medical clearance for her to restart OCP if still has this pain.

## 2013-09-04 NOTE — Patient Instructions (Signed)
Stop birth control pill as directed Use other means of birth control If chest pain go away then maybe related to birth control if still there then see PCP  and get medical clearance before restarting birth control pills

## 2013-09-06 LAB — IPS N GONORRHOEA AND CHLAMYDIA BY PCR

## 2013-09-07 NOTE — Progress Notes (Signed)
Encounter reviewed by Dr. Nakyah Erdmann Silva.  

## 2013-09-11 ENCOUNTER — Telehealth: Payer: Self-pay | Admitting: *Deleted

## 2013-09-11 NOTE — Telephone Encounter (Signed)
I have attempted to contact this patient by phone with the following results: left message to return my call on answering machine (home/mobile).  

## 2013-09-11 NOTE — Telephone Encounter (Signed)
Message copied by Luisa DagoPHILLIPS, Breylen Agyeman C on Mon Sep 11, 2013 11:05 AM ------      Message from: Ria CommentGRUBB, PATRICIA R      Created: Thu Sep 07, 2013  9:53 AM       Let patient know that Chlamydia is still +, she had concerns that it was positive because of being with her partner again before he was treated.  I chose not to call her on Thursday due to the weather she cold not go out and get med's most likely.  So please give her a call.  Needs Health Department card completed, notify partner(S), TOC in 12 weeks. Needs repeat of Zithromax 250 mg X 4 doses. ------

## 2013-09-12 NOTE — Telephone Encounter (Signed)
Pt returning call

## 2013-09-13 MED ORDER — AZITHROMYCIN 250 MG PO TABS: ORAL_TABLET | ORAL | Status: DC

## 2013-09-13 NOTE — Telephone Encounter (Signed)
Pt notified in result note.  RX sent to pharmacy.

## 2013-12-12 ENCOUNTER — Encounter: Payer: Self-pay | Admitting: Nurse Practitioner

## 2013-12-12 ENCOUNTER — Ambulatory Visit (INDEPENDENT_AMBULATORY_CARE_PROVIDER_SITE_OTHER): Payer: BC Managed Care – PPO | Admitting: Nurse Practitioner

## 2013-12-12 VITALS — BP 104/68 | HR 70 | Resp 14 | Ht 66.25 in | Wt 129.0 lb

## 2013-12-12 DIAGNOSIS — A749 Chlamydial infection, unspecified: Secondary | ICD-10-CM

## 2013-12-12 NOTE — Patient Instructions (Signed)
We will call you with test results or via My Chart

## 2013-12-12 NOTE — Progress Notes (Signed)
23 y.o. Single African American female G1P0010 here for follow up TOC for Chlamydia treated with Zithromax initiated on 09/04/13.  She has previously been found to have + chlamydia on 05/24/13.  Partner at that time stated he was going for testing and treatment but never did.  She has since ended that relationship.   Now with a new partner since last treated and he is getting testing today as well.  Completed all medication as directed.  Denies any symptoms of abdominal pain, no vaginal discharge, pelvic pain or dyspareunia     O: Healthy WD,WN female  Affect: normal  Abdomen: soft and non tender  Pelvic exam:VAGINA: no abnormal discharge or lesions CERVIX: no lesions or cervical motion tenderness UTERUS: gravid and anteverted ADNEXA: no masses palpable and non tender  A:  TOC for Chlamydia     P:  Discussed findings of test that will be called to her  Follow up with boyfriend's test results as well.   Labs: GC & [facility]

## 2013-12-15 NOTE — Progress Notes (Signed)
Encounter reviewed by Dr. Brook Silva.  

## 2013-12-16 LAB — IPS N GONORRHOEA AND CHLAMYDIA BY PCR

## 2014-03-26 ENCOUNTER — Encounter (HOSPITAL_COMMUNITY): Payer: Self-pay | Admitting: Emergency Medicine

## 2014-03-26 ENCOUNTER — Emergency Department (HOSPITAL_COMMUNITY): Payer: 59

## 2014-03-26 ENCOUNTER — Emergency Department (HOSPITAL_COMMUNITY)
Admission: EM | Admit: 2014-03-26 | Discharge: 2014-03-26 | Disposition: A | Discharge status: Home / Self Care | Payer: 59 | Attending: Emergency Medicine | Admitting: Emergency Medicine

## 2014-03-26 DIAGNOSIS — S61219A Laceration without foreign body of unspecified finger without damage to nail, initial encounter: Secondary | ICD-10-CM

## 2014-03-26 DIAGNOSIS — Z8619 Personal history of other infectious and parasitic diseases: Secondary | ICD-10-CM | POA: Diagnosis not present

## 2014-03-26 DIAGNOSIS — Y9289 Other specified places as the place of occurrence of the external cause: Secondary | ICD-10-CM | POA: Insufficient documentation

## 2014-03-26 DIAGNOSIS — S61209A Unspecified open wound of unspecified finger without damage to nail, initial encounter: Secondary | ICD-10-CM | POA: Insufficient documentation

## 2014-03-26 DIAGNOSIS — Y9389 Activity, other specified: Secondary | ICD-10-CM | POA: Diagnosis not present

## 2014-03-26 DIAGNOSIS — S41109A Unspecified open wound of unspecified upper arm, initial encounter: Secondary | ICD-10-CM | POA: Insufficient documentation

## 2014-03-26 DIAGNOSIS — Z79899 Other long term (current) drug therapy: Secondary | ICD-10-CM | POA: Diagnosis not present

## 2014-03-26 DIAGNOSIS — IMO0002 Reserved for concepts with insufficient information to code with codable children: Secondary | ICD-10-CM | POA: Diagnosis not present

## 2014-03-26 DIAGNOSIS — W268XXA Contact with other sharp object(s), not elsewhere classified, initial encounter: Secondary | ICD-10-CM | POA: Insufficient documentation

## 2014-03-26 MED ORDER — ONDANSETRON 4 MG PO TBDP
4.0000 mg | ORAL_TABLET | Freq: Once | ORAL | Status: AC
  Administered 2014-03-26: 4 mg via ORAL
  Filled 2014-03-26: qty 1

## 2014-03-26 MED ORDER — BACITRACIN 500 UNIT/GM EX OINT
1.0000 "application " | TOPICAL_OINTMENT | Freq: Two times a day (BID) | CUTANEOUS | Status: DC
  Administered 2014-03-26: 1 via TOPICAL
  Filled 2014-03-26 (×17): qty 14

## 2014-03-26 MED ORDER — MORPHINE SULFATE 4 MG/ML IJ SOLN
4.0000 mg | Freq: Once | INTRAMUSCULAR | Status: AC
  Administered 2014-03-26: 4 mg via INTRAMUSCULAR
  Filled 2014-03-26: qty 1

## 2014-03-26 MED ORDER — CEPHALEXIN 500 MG PO CAPS: 500.0000 mg | ORAL_CAPSULE | Freq: Four times a day (QID) | ORAL | Status: DC

## 2014-03-26 MED ORDER — LIDOCAINE HCL (PF) 1 % IJ SOLN
30.0000 mL | Freq: Once | INTRAMUSCULAR | Status: AC
  Administered 2014-03-26: 30 mL via INTRADERMAL
  Filled 2014-03-26: qty 30

## 2014-03-26 MED ORDER — CEPHALEXIN 250 MG PO CAPS
500.0000 mg | ORAL_CAPSULE | Freq: Once | ORAL | Status: AC
  Administered 2014-03-26: 500 mg via ORAL
  Filled 2014-03-26: qty 2

## 2014-03-26 NOTE — ED Notes (Signed)
Declined W/C at D/C and was escorted to lobby by RN. 

## 2014-03-26 NOTE — ED Provider Notes (Signed)
CSN: [id number]     Arrival date & time 03/26/14  1728 History   First MD Initiated Contact with Patient 03/26/14 2024     Chief Complaint  Patient presents with  . Extremity Laceration     (Consider location/radiation/quality/duration/timing/severity/associated sxs/prior Treatment) HPI  Patricia Novak is a 23 y.o. female complaining of laceration to left middle finger after patient was moving furniture and she cut her finger stuck in between the sofa and the railing. States her pain is severe, 10 out of 10, she denies numbness, weakness, decreased range of motion. Since her last shot was within the last 5 years.  Past Medical History  Diagnosis Date  . Chest pain 2012  . Pericarditis 2012    ? with normal ECHO 04/2011  . STD (sexually transmitted disease)     chlamydia x 2 2015   Past Surgical History  Procedure Laterality Date  . Wisdom teeth     Family History  Problem Relation Age of Onset  . Hypertension Other   . Diabetes Other   . Coronary artery disease Other   . Migraines Mother   . Diabetes Maternal Grandmother   . Hypertension Maternal Grandmother   . Aneurysm Maternal Grandfather   . Heart disease Paternal Grandmother    History  Substance Use Topics  . Smoking status: Never Smoker   . Smokeless tobacco: Never Used  . Alcohol Use: 1.5 oz/week    3 drink(s) per week   OB History   Grav Para Term Preterm Abortions TAB SAB Ect Mult Living   Review of Systems  10 systems reviewed and found to be negative, except as noted in the HPI.   Allergies  Review of patient's allergies indicates no known allergies.  Home Medications   Prior to Admission medications   Medication Sig Start Date End Date Taking? Authorizing Provider  levonorgestrel-ethinyl estradiol (AVIANE,ALESSE,LESSINA) 0.1-20 MG-MCG tablet Take 1 tablet by mouth daily. 05/24/13  Yes Patricia Rolen-Grubb, FNP  cephALEXin (KEFLEX) 500 MG capsule Take 1 capsule (500 mg total)  by mouth 4 (four) times daily. 03/26/14   Loralai Eisman, PA-C   BP 114/75  Pulse 79  Temp(Src) 98.3 F (36.8 C)  Resp 12  Ht 5' 6.75" (1.695 m)  Wt 127 lb 8 oz (57.834 kg)  BMI 20.13 kg/m2  SpO2 100%  LMP 02/23/2014 Physical Exam  Nursing note and vitals reviewed. Constitutional: She is oriented to person, place, and time. She appears well-developed and well-nourished. No distress.  HENT:  Head: Normocephalic.  Eyes: Conjunctivae and EOM are normal.  Cardiovascular: Normal rate.   Pulmonary/Chest: Effort normal. No stridor.  Musculoskeletal: Normal range of motion.  Neurological: She is alert and oriented to person, place, and time.  Skin:  Irregular full-thickness laceration, flap-like to volar surface of left third PIP. Patient has excellent range of motion, she is distally neurovascularly intact.  Psychiatric: She has a normal mood and affect.    ED Course  Procedures (including critical care time) Labs Review Labs Reviewed - No data to display  Imaging Review Dg Finger Middle Left  03/26/2014   CLINICAL DATA:  Laceration  EXAM: LEFT MIDDLE FINGER 2+V  COMPARISON:  None  FINDINGS: No evidence of fracture, malalignment for retained radiopaque foreign body. Soft tissue irregularity consistent with the clinical history of laceration. The remainder the visualized bones and joints are unremarkable.  IMPRESSION: Soft tissue laceration without bony injury or  retained radiopaque foreign body.   Electronically Signed   By: Malachy Moan M.D.   On: 03/26/2014 22:36     EKG Interpretation None      MDM   Final diagnoses:  Finger laceration, initial encounter    Filed Vitals:   03/26/14 1809 03/26/14 2301  BP: 109/73 114/75  Pulse: 93 79  Temp: 98.3 F (36.8 C)   TempSrc:  Oral  Resp: 16 12  Height: 5' 6.75" (1.695 m)   Weight: 127 lb 8 oz (57.834 kg)   SpO2: 100% 100%    Medications  bacitracin ointment 1 application (1 application Topical Given 03/26/14  2250)  morphine 4 MG/ML injection 4 mg (4 mg Intramuscular Given 03/26/14 2112)  lidocaine (PF) (XYLOCAINE) 1 % injection 30 mL (30 mLs Intradermal Given by Other 03/26/14 2112)  cephALEXin (KEFLEX) capsule 500 mg (500 mg Oral Given 03/26/14 2250)  ondansetron (ZOFRAN-ODT) disintegrating tablet 4 mg (4 mg Oral Given 03/26/14 2251)    Patricia Novak is a 23 y.o. female presenting with laceration to volar surface of left third digit distal phalanx. She is neurovascularly intact. Wound is irrigated profusely and closed with 5-0 Ethilon 6 sutures placed. X-ray with no bony abnormalities. Patient will be paced placed in finger splint and advised to follow with hand surgery, extensive discussion of return precautions for infection.  Evaluation does not show pathology that would require ongoing emergent intervention or inpatient treatment. Pt is hemodynamically stable and mentating appropriately. Discussed findings and plan with patient/guardian, who agrees with care plan. All questions answered. Return precautions discussed and outpatient follow up given.   Discharge Medication List as of 03/26/2014 11:15 PM    START taking these medications   Details  cephALEXin (KEFLEX) 500 MG capsule Take 1 capsule (500 mg total) by mouth 4 (four) times daily., Starting 03/26/2014, Until Discontinued, [employer], PA-C 03/27/14 [phone]

## 2014-03-26 NOTE — Discharge Instructions (Signed)
Keep wound dry and do not remove dressing for 24 hours if possible. After that, wash gently morning and night (every 12 hours) with soap and water. Use a topical antibiotic ointment and cover with a bandaid or gauze.    Do NOT use rubbing alcohol or hydrogen peroxide, do not soak the area   Present to your primary care doctor or the urgent care of your choice, or the ED for suture removal in 7 days.   Every attempt was made to remove foreign body (contaminants) from the wound.  However, there is always a chance that some may remain in the wound. This can  increase your risk of infection.   If you see signs of infection (warmth, redness, tenderness, pus, sharp increase in pain, fever, red streaking in the skin) immediately return to the emergency department.   After the wound heals fully, apply sunscreen for 6-12 months to minimize scarring.   Take your antibiotics as directed and to completion. You should never have any leftover antibiotics! Push fluids and stay well hydrated.

## 2014-03-26 NOTE — ED Notes (Signed)
Pt reports today was moving furniture and left middle finger got stuck between furniture. Has laceration to middle finger, is 1 inch. Bleeding controlled. Sensation intact.

## 2014-03-26 NOTE — ED Notes (Signed)
Suture cart set up at bedside  

## 2014-03-28 NOTE — ED Provider Notes (Signed)
Medical screening examination/treatment/procedure(s) were performed by non-physician practitioner and as supervising physician I was immediately available for consultation/collaboration.  Festus Pursel L Josaphine Shimamoto, MD 03/28/14 0739 

## 2014-04-26 ENCOUNTER — Other Ambulatory Visit: Payer: Self-pay | Admitting: Nurse Practitioner

## 2014-04-26 MED ORDER — LEVONORGESTREL-ETHINYL ESTRAD 0.1-20 MG-MCG PO TABS: 1.0000 | ORAL_TABLET | Freq: Every day | ORAL | Status: DC

## 2014-04-26 NOTE — Telephone Encounter (Signed)
Incoming Refill Request from [facility]:[id number]:Aviane 0.1-20mg -mcg  Last AEX:05/24/13 Last Refill: 05/24/13  #3 X 3 Next AEX:05/28/14   Sent 1 mth supply in until pt has her AEX

## 2014-05-14 ENCOUNTER — Encounter (HOSPITAL_COMMUNITY): Payer: Self-pay | Admitting: Emergency Medicine

## 2014-05-26 ENCOUNTER — Other Ambulatory Visit: Payer: Self-pay | Admitting: Nurse Practitioner

## 2014-05-28 ENCOUNTER — Ambulatory Visit: Payer: BC Managed Care – PPO | Admitting: Nurse Practitioner

## 2014-05-28 NOTE — Telephone Encounter (Signed)
Please advise Ms. Patricia Novak

## 2014-05-28 NOTE — Telephone Encounter (Signed)
Last refilled; 04/26/14 #1 pack only  Left Message To Call Back to schedule AEX

## 2014-05-28 NOTE — Telephone Encounter (Signed)
Patient is returning a call to Patricia Novak. Patient is unable to come to appointment due to account balance. Patient is asking for 1 refill until she can schedule aex.

## 2014-05-29 NOTE — Telephone Encounter (Signed)
LM on patient's vm that rx has been sent and whenever she can she needs to schedule AEX, until then no refill will be sent.

## 2014-06-27 ENCOUNTER — Encounter: Payer: Self-pay | Admitting: Nurse Practitioner

## 2014-08-21 ENCOUNTER — Encounter: Payer: Self-pay | Admitting: Nurse Practitioner

## 2016-01-13 IMAGING — CR DG FINGER MIDDLE 2+V*L*
3 series · 3 of 3 positions shown · non-contrast
Comparison: None

CLINICAL DATA: Laceration

EXAM:
LEFT MIDDLE FINGER 2+V

[x finger pa left]
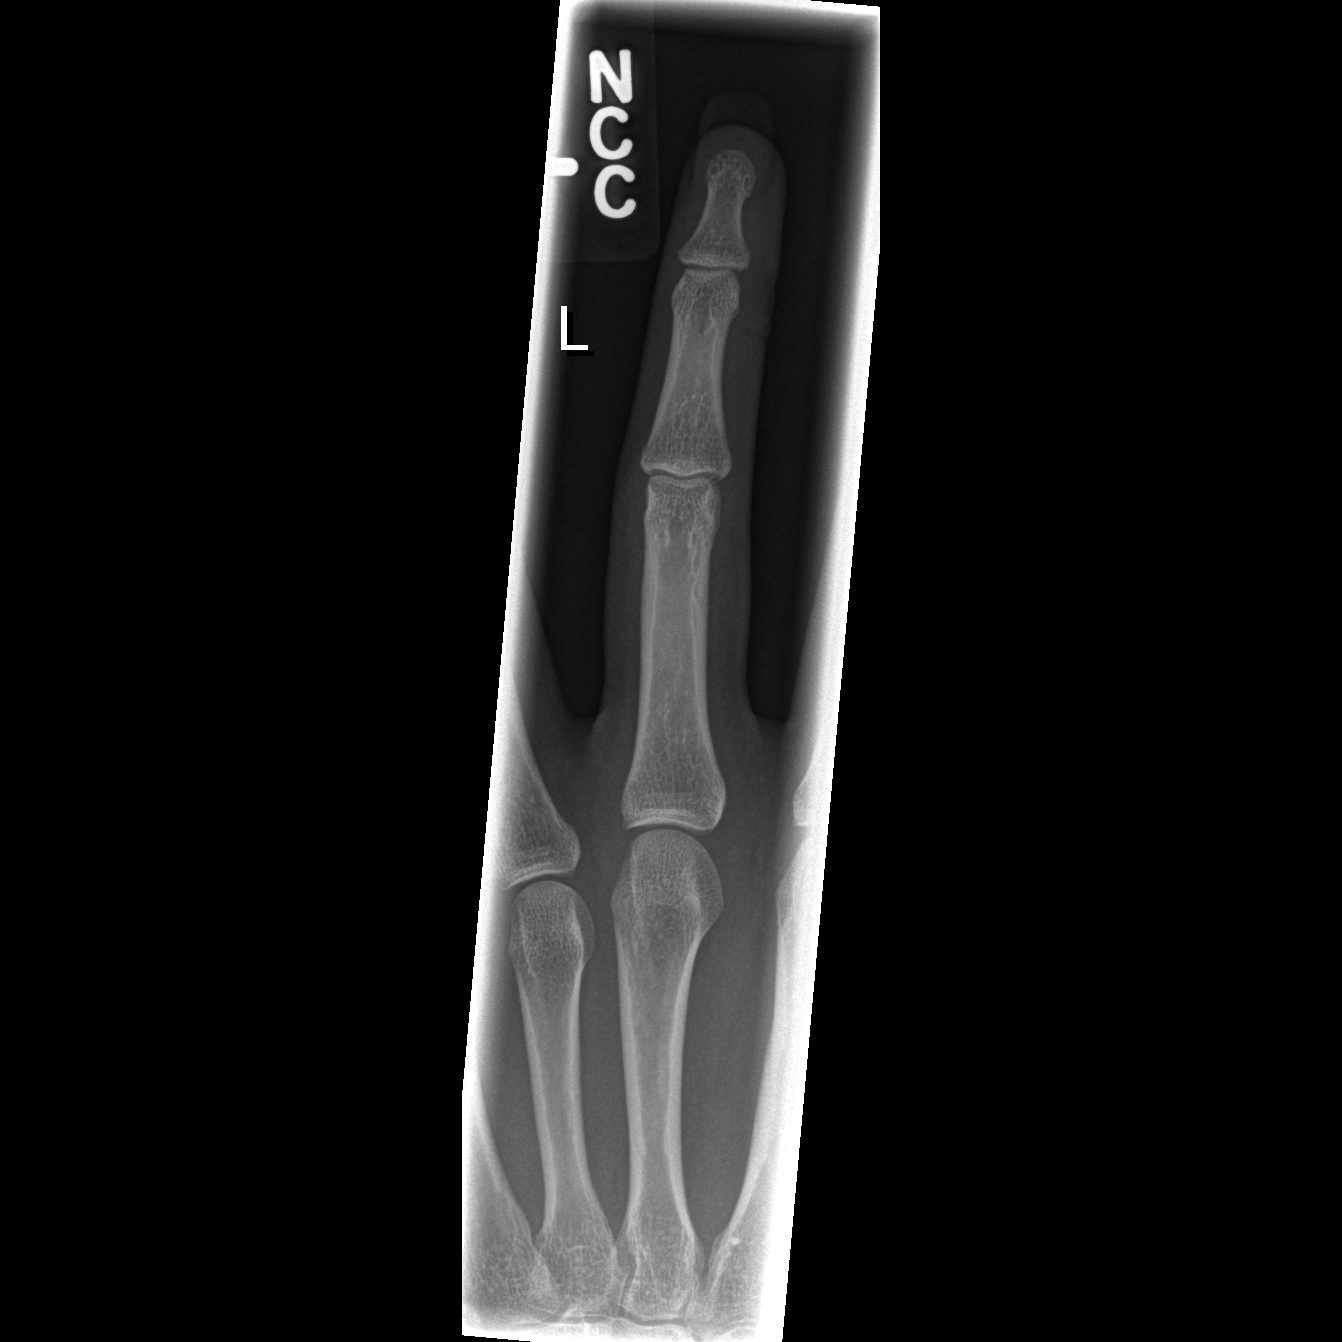

[x finger obl. left]
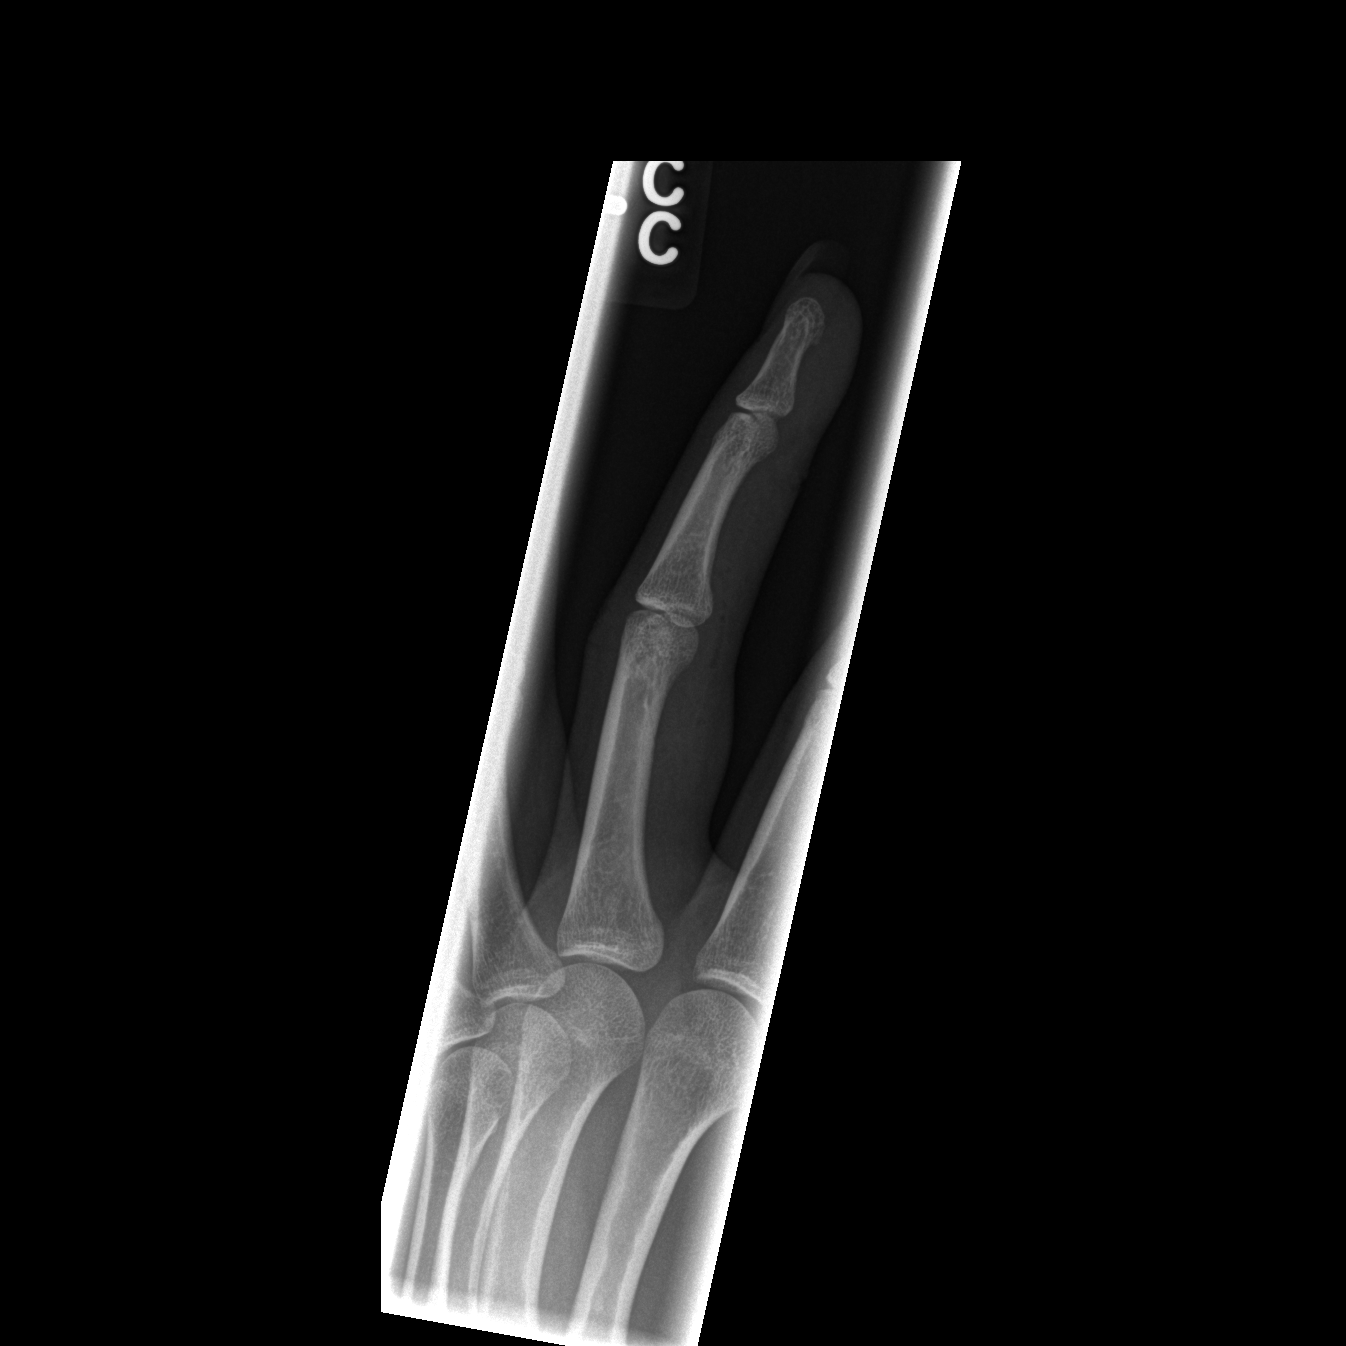

[x finger lateral left]
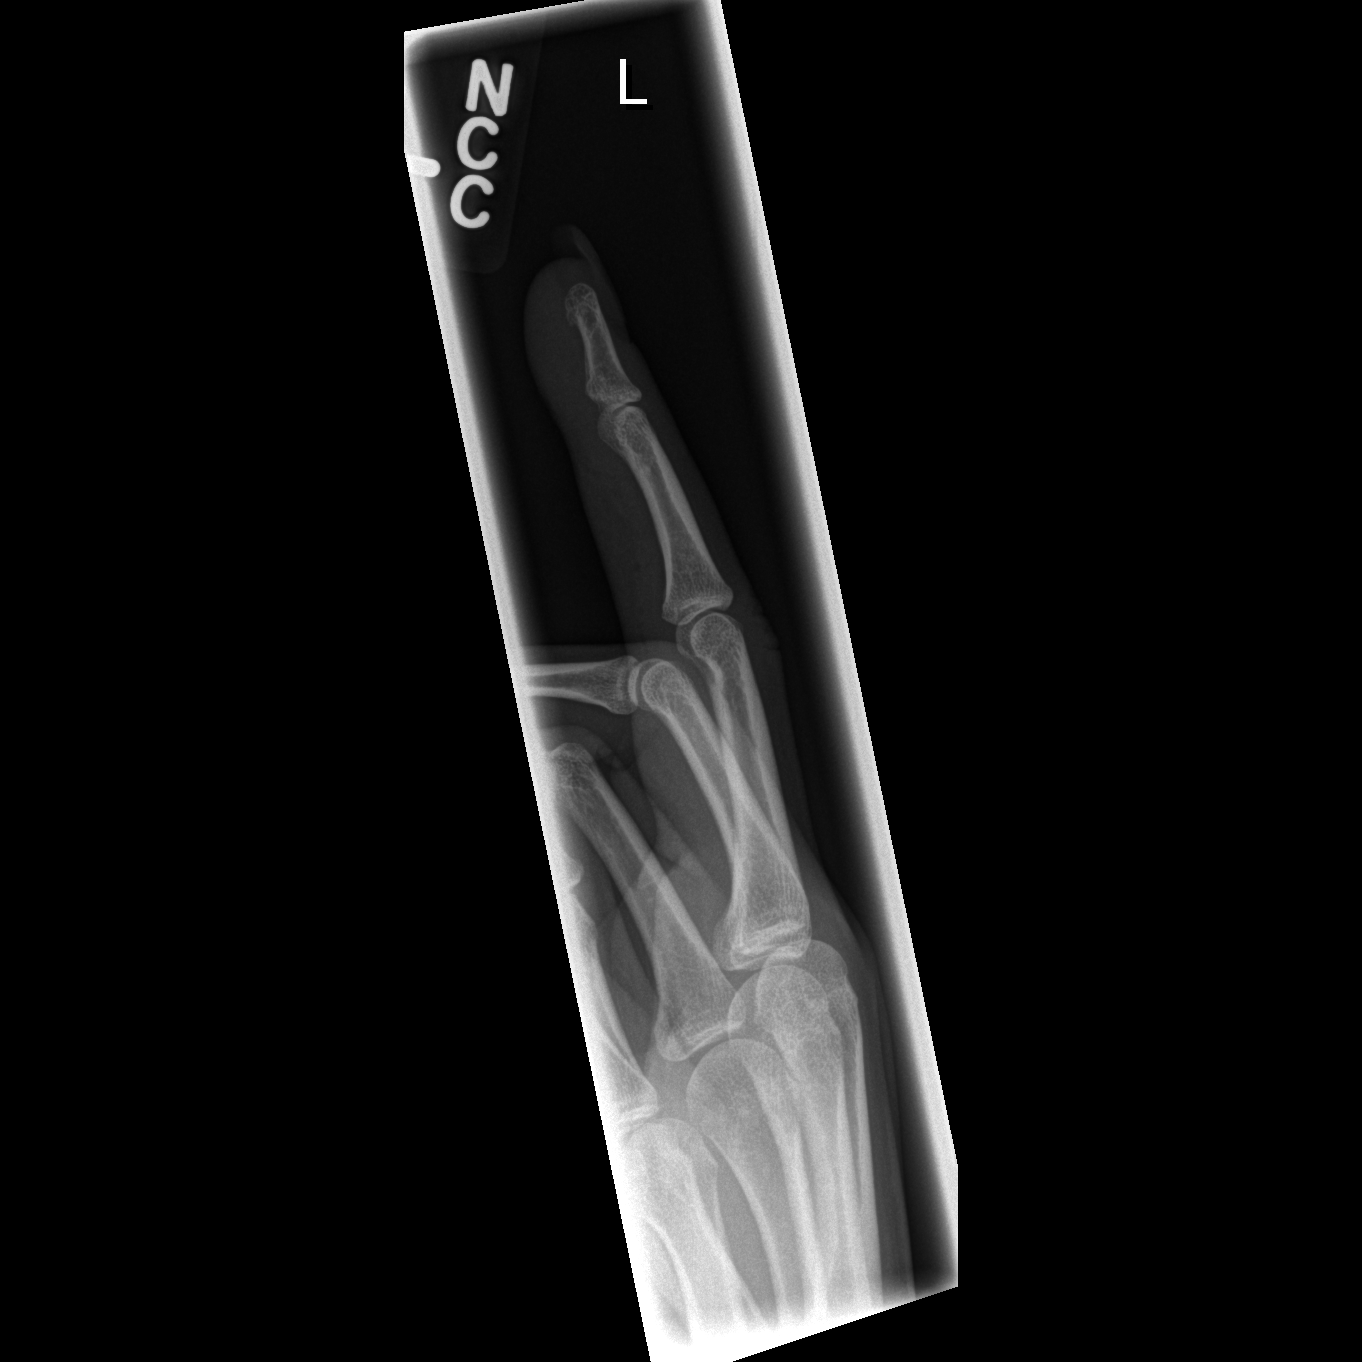

[3 of 3 positions shown; findings below may reference images not displayed]

FINDINGS: No evidence of fracture, malalignment for retained radiopaque
foreign body. Soft tissue irregularity consistent with the clinical
history of laceration. The remainder the visualized bones and joints
are unremarkable.
IMPRESSION: Soft tissue laceration without bony injury or retained radiopaque
foreign body.

## 2016-03-15 ENCOUNTER — Emergency Department (HOSPITAL_COMMUNITY)
Admission: EM | Admit: 2016-03-15 | Discharge: 2016-03-15 | Disposition: A | Discharge status: Home / Self Care | Payer: PRIVATE HEALTH INSURANCE | Attending: Emergency Medicine | Admitting: Emergency Medicine

## 2016-03-15 ENCOUNTER — Encounter (HOSPITAL_COMMUNITY): Payer: Self-pay | Admitting: Emergency Medicine

## 2016-03-15 DIAGNOSIS — R040 Epistaxis: Secondary | ICD-10-CM | POA: Diagnosis present

## 2016-03-15 MED ORDER — OXYMETAZOLINE HCL 0.05 % NA SOLN
1.0000 | Freq: Once | NASAL | Status: AC
  Administered 2016-03-15: 1 via NASAL
  Filled 2016-03-15: qty 15

## 2016-03-15 MED ORDER — SILVER NITRATE-POT NITRATE 75-25 % EX MISC
1.0000 "application " | Freq: Once | CUTANEOUS | Status: DC
  Filled 2016-03-15: qty 1

## 2016-03-15 NOTE — ED Provider Notes (Signed)
[facility]-EMERGENCY DEPT Provider Note   CSN: [id number] Arrival date & time: 03/15/16  1515     History   Chief Complaint Chief Complaint  Patient presents with  . Epistaxis    HPI Patricia Arthursanora Staszak is a 25 y.o. female.  The history is provided by the patient.  Epistaxis   This is a new problem. The current episode started 1 to 2 hours ago. The problem occurs constantly. The problem has not changed since onset.The problem is associated with an unknown factor. The bleeding has been from both (right resolved; left remains) nares. Her past medical history does not include bleeding disorder, sinus problems or nose-picking.    Past Medical History:  Diagnosis Date  . Chest pain 2012  . Pericarditis 2012   ? with normal ECHO 04/2011  . STD (sexually transmitted disease)    chlamydia x 2 2015    Patient Active Problem List   Diagnosis Date Noted  . Chest pain 04/19/2011    Past Surgical History:  Procedure Laterality Date  . wisdom teeth      OB History    Gravida Para Term Preterm AB Living   2       1     SAB TAB Ectopic Multiple Live Births     1             Home Medications    Prior to Admission medications   Medication Sig Start Date End Date Taking? Authorizing Provider  ibuprofen (ADVIL,MOTRIN) 200 MG tablet Take 1,000 mg by mouth daily as needed for headache or moderate pain.   Yes Historical Provider, MD    Family History Family History  Problem Relation Age of Onset  . Migraines Mother   . Aneurysm Maternal Grandfather   . Hypertension Other   . Diabetes Other   . Coronary artery disease Other   . Diabetes Maternal Grandmother   . Hypertension Maternal Grandmother   . Heart disease Paternal Grandmother     Social History Social History  Substance Use Topics  . Smoking status: Never Smoker  . Smokeless tobacco: Never Used  . Alcohol use 1.5 oz/week    3 Standard drinks or equivalent per week     Allergies   Review of patient's allergies  indicates no known allergies.   Review of Systems Review of Systems  Constitutional: Negative for chills and fever.  HENT: Positive for nosebleeds. Negative for congestion and facial swelling.   Eyes: Negative for visual disturbance.  Respiratory: Negative for cough and shortness of breath.   Cardiovascular: Negative for chest pain.  Gastrointestinal: Negative for blood in stool.  Genitourinary: Negative for vaginal bleeding.  All other systems reviewed and are negative.    Physical Exam Updated Vital Signs BP 120/99 (BP Location: Left Arm)   Pulse 65   Temp 98.2 F (36.8 C) (Oral)   Resp 16   Ht 5\' 6"  (1.676 m)   Wt 130 lb (59 kg)   LMP 02/07/2016   SpO2 100%   BMI 20.98 kg/m   Physical Exam  Constitutional: She is oriented to person, place, and time. She appears well-developed and well-nourished. No distress.  HENT:  Head: Normocephalic and atraumatic.  Nose: Epistaxis (with thick clot in left nare. no source visualized in the anterior region.) is observed.  Eyes: Conjunctivae and EOM are normal. Pupils are equal, round, and reactive to light. Right eye exhibits no discharge. Left eye exhibits no discharge. No scleral icterus.  Neck: Normal range of  motion. Neck supple.  Cardiovascular: Normal rate and regular rhythm.  Exam reveals no gallop and no friction rub.   No murmur heard. Pulmonary/Chest: Effort normal and breath sounds normal. No stridor. No respiratory distress. She has no rales.  Abdominal: Soft. She exhibits no distension. There is no tenderness.  Musculoskeletal: She exhibits no edema or tenderness.  Neurological: She is alert and oriented to person, place, and time.  Skin: Skin is warm and dry. No rash noted. She is not diaphoretic. No erythema.  Psychiatric: She has a normal mood and affect.  Vitals reviewed.    ED Treatments / Results  Labs (all labs ordered are listed, but only abnormal results are displayed) Labs Reviewed - No data to  display  EKG  EKG Interpretation None       Radiology No results found.  Procedures .Epistaxis Management Date/Time: 03/15/2016 4:18 PM Performed by: Nira Conn Authorized by: Nira Conn   Consent:    Consent obtained:  Verbal   Consent given by:  Patient   Risks discussed:  Bleeding   Alternatives discussed:  No treatment Anesthesia (see MAR for exact dosages):    Anesthesia method:  None Procedure details:    Treatment method:  Anterior pack (and afrin)   Treatment complexity:  Limited   Treatment episode: recurring   Post-procedure details:    Assessment:  Bleeding stopped   Patient tolerance of procedure:  Tolerated well, no immediate complications    (including critical care time)  Medications Ordered in ED Medications  oxymetazoline (AFRIN) 0.05 % nasal spray 1 spray (1 spray Each Nare Given 03/15/16 1648)     Initial Impression / Assessment and Plan / ED Course  I have reviewed the triage vital signs and the nursing notes.  Pertinent labs & imaging results that were available during my care of the patient were reviewed by me and considered in my medical decision making (see chart for details).  Clinical Course    Controlled with suction, light packing, and afrin. No packing left in place. Completely resolved. Sent home with afrin.   Safe for discharge with strict return precautions.  Final Clinical Impressions(s) / ED Diagnoses   Final diagnoses:  Epistaxis   Disposition: Discharge  Condition: Good  I have discussed the results, Dx and Tx plan with the patient who expressed understanding and agree(s) with the plan. Discharge instructions discussed at great length. The patient was given strict return precautions who verbalized understanding of the instructions. No further questions at time of discharge.    Discharge Medication List as of 03/15/2016  5:32 PM      Follow Up: [facility] [address] [place] [address] [phone] Call  For help establishing care with a care provider      Nira Conn, MD 03/16/16 [phone]

## 2016-03-15 NOTE — ED Triage Notes (Signed)
Pt's nose started bleeding about 1445 today.  Pt denies any injury.  Reports history of nosebleeds.  Denies any blood thinners or acne medication use.

## 2016-08-17 ENCOUNTER — Emergency Department (HOSPITAL_COMMUNITY): Admission: EM | Admit: 2016-08-17 | Discharge: 2016-08-17 | Discharge status: Against Medical Advice | Payer: 59
